# Patient Record
Sex: Male | Born: 1965 | Race: White | Hispanic: No | Marital: Married
Health system: Southern US, Community
[De-identification: ages and names within clinical notes are randomized; demographics above are authoritative.]

---

## 2020-10-30 ENCOUNTER — Other Ambulatory Visit: Payer: Self-pay

## 2020-10-30 ENCOUNTER — Emergency Department (HOSPITAL_COMMUNITY)
Admission: EM | Admit: 2020-10-30 | Discharge: 2020-10-30 | Disposition: A | Discharge status: Home / Self Care | Payer: Worker's Compensation | Attending: Emergency Medicine | Admitting: Emergency Medicine

## 2020-10-30 ENCOUNTER — Encounter (HOSPITAL_COMMUNITY): Payer: Self-pay | Admitting: Pharmacy Technician

## 2020-10-30 ENCOUNTER — Emergency Department (HOSPITAL_COMMUNITY): Payer: Worker's Compensation

## 2020-10-30 DIAGNOSIS — Z77028 Contact with and (suspected) exposure to other hazardous aromatic compounds: Secondary | ICD-10-CM | POA: Diagnosis not present

## 2020-10-30 DIAGNOSIS — Y990 Civilian activity done for income or pay: Secondary | ICD-10-CM | POA: Diagnosis not present

## 2020-10-30 DIAGNOSIS — R0602 Shortness of breath: Secondary | ICD-10-CM | POA: Insufficient documentation

## 2020-10-30 DIAGNOSIS — R519 Headache, unspecified: Secondary | ICD-10-CM | POA: Insufficient documentation

## 2020-10-30 DIAGNOSIS — Z7729 Contact with and (suspected ) exposure to other hazardous substances: Secondary | ICD-10-CM

## 2020-10-30 LAB — BASIC METABOLIC PANEL
Anion gap: 12 (ref 5–15)
BUN: 16 mg/dL (ref 6–20)
CO2: 21 mmol/L — ABNORMAL LOW (ref 22–32)
Calcium: 9.6 mg/dL (ref 8.9–10.3)
Chloride: 100 mmol/L (ref 98–111)
Creatinine, Ser: 0.98 mg/dL (ref 0.61–1.24)
GFR, Estimated: >60 mL/min (ref >60)
Glucose, Bld: 90 mg/dL (ref 70–99)
Potassium: 4.1 mmol/L (ref 3.5–5.1)
Sodium: 133 mmol/L — ABNORMAL LOW (ref 135–145)

## 2020-10-30 LAB — CBC WITH DIFFERENTIAL/PLATELET
Abs Immature Granulocytes: 0.1 10*3/uL — ABNORMAL HIGH (ref 0.00–0.07)
Basophils Absolute: 0.1 10*3/uL (ref 0.0–0.1)
Basophils Relative: 0 %
Eosinophils Absolute: 0.2 10*3/uL (ref 0.0–0.5)
Eosinophils Relative: 1 %
HCT: 43.3 % (ref 39.0–52.0)
Hemoglobin: 15.2 g/dL (ref 13.0–17.0)
Immature Granulocytes: 1 %
Lymphocytes Relative: 22 %
Lymphs Abs: 4.2 10*3/uL — ABNORMAL HIGH (ref 0.7–4.0)
MCH: 34 pg (ref 26.0–34.0)
MCHC: 35.1 g/dL (ref 30.0–36.0)
MCV: 96.9 fL (ref 80.0–100.0)
Monocytes Absolute: 1.2 10*3/uL — ABNORMAL HIGH (ref 0.1–1.0)
Monocytes Relative: 7 %
Neutro Abs: 13.3 10*3/uL — ABNORMAL HIGH (ref 1.7–7.7)
Neutrophils Relative %: 69 %
Platelets: 275 10*3/uL (ref 150–400)
RBC: 4.47 MIL/uL (ref 4.22–5.81)
RDW: 12.9 % (ref 11.5–15.5)
WBC: 19.1 10*3/uL — ABNORMAL HIGH (ref 4.0–10.5)
nRBC: 0 % (ref 0.0–0.2)

## 2020-10-30 LAB — COOXEMETRY PANEL
Carboxyhemoglobin: 8.6 % (ref 0.5–1.5)
Methemoglobin: 1.2 % (ref 0.0–1.5)
O2 Saturation: 87.2 %
Total hemoglobin: 15.4 g/dL (ref 12.0–16.0)

## 2020-10-30 NOTE — ED Provider Notes (Signed)
Emergency Medicine Provider Triage Evaluation Note  Jeremiah Klein , a 55 y.o. male  was evaluated in triage.  Pt complains of of carbon monoxide exposure at work.  Reports headache, shortness of breath and watery eyes.  Symptoms have improved since leaving work earlier today and "breathing fresh air."  Review of Systems  Positive: Shortness of breath, headache Negative: Chest pain  Physical Exam  BP 133/62 (BP Location: Right Arm)   Pulse 82   Temp 98.8 F (37.1 C) (Oral)   Resp 18   SpO2 99%  Gen:   Awake, no distress Resp:  Normal effort MSK:   Moves extremities without difficulty Other:  Speaking complete sentences without signs of respiratory distress  Medical Decision Making  Medically screening exam initiated at 2:54 PM.  Appropriate orders placed.  Jeremiah Klein was informed that the remainder of the evaluation will be completed by another provider, this initial triage assessment does not replace that evaluation, and the importance of remaining in the ED until their evaluation is complete.  Will order labs cxr coox   Jeremiah Pates, PA-C 10/30/20 1455    Jeremiah Grizzle, MD 11/02/20 [phone]

## 2020-10-30 NOTE — ED Triage Notes (Signed)
Pt here with carbon monoxide exposure from work. Pt endorses headache, eye irritation and difficulty breathing. 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

## 2020-10-30 NOTE — ED Provider Notes (Signed)
[facility] EMERGENCY DEPARTMENT Provider Note   CSN: [id number] Arrival date & time: 10/30/20  1320     History No chief complaint on file.   Jeremiah Klein is a 55 y.o. male.  HPI Patient presents with his car monoxide exposure.  Has had a headache and some mild shortness of breath.  Had multiple exposures at work.  Dull headache.  States he is feeling somewhat better than he had been prior.  No chest pain.  States it does feel little strange he takes a deep breath.  He is a smoker.    No past medical history on file.  There are no problems to display for this patient.   History reviewed. No pertinent surgical history.     No family history on file.     Home Medications Prior to Admission medications   Not on File    Allergies    Patient has no known allergies.  Review of Systems   Review of Systems  Constitutional: Negative for appetite change.  HENT: Negative for congestion.   Respiratory: Positive for shortness of breath.   Cardiovascular: Negative for chest pain.  Gastrointestinal: Negative for abdominal pain.  Genitourinary: Negative for flank pain.  Musculoskeletal: Negative for back pain.  Skin: Negative for rash.  Neurological: Positive for headaches.  Psychiatric/Behavioral: Negative for confusion.    Physical Exam Updated Vital Signs BP 112/62   Pulse 71   Temp 98.1 F (36.7 C) (Oral)   Resp 16   SpO2 100%   Physical Exam Vitals and nursing note reviewed.  HENT:     Head: Atraumatic.  Eyes:     Pupils: Pupils are equal, round, and reactive to light.  Cardiovascular:     Rate and Rhythm: Regular rhythm.  Pulmonary:     Effort: Pulmonary effort is normal.     Breath sounds: No wheezing, rhonchi or rales.  Abdominal:     Tenderness: There is no abdominal tenderness.  Musculoskeletal:        General: No tenderness.     Cervical back: Neck supple.  Skin:    General: Skin is warm.     Capillary Refill: Capillary  refill takes less than 2 seconds.  Neurological:     Mental Status: He is alert and oriented to person, place, and time.     ED Results / Procedures / Treatments   Labs (all labs ordered are listed, but only abnormal results are displayed) Labs Reviewed  BASIC METABOLIC PANEL - Abnormal; Notable for the following components:      Result Value   Sodium 133 (*)    CO2 21 (*)    All other components within normal limits  CBC WITH DIFFERENTIAL/PLATELET - Abnormal; Notable for the following components:   WBC 19.1 (*)    Neutro Abs 13.3 (*)    Lymphs Abs 4.2 (*)    Monocytes Absolute 1.2 (*)    Abs Immature Granulocytes 0.10 (*)    All other components within normal limits  COOXEMETRY PANEL - Abnormal; Notable for the following components:   Carboxyhemoglobin 8.6 (*)    All other components within normal limits    EKG None  Radiology DG Chest 2 View  Result Date: 10/30/2020 CLINICAL DATA:  Shortness of breath.  Carbon monoxide exposure EXAM: CHEST - 2 VIEW COMPARISON:  None. FINDINGS: Heart is normal size. No confluent opacities or effusions. No acute bony abnormality. IMPRESSION: No active cardiopulmonary disease. Electronically Signed   By: Caryn Bee  [place] M.D.   On: 10/30/2020 15:19    Procedures Procedures   Medications Ordered in ED Medications - No data to display  ED Course  I have reviewed the triage vital signs and the nursing notes.  Pertinent labs & imaging results that were available during my care of the patient were reviewed by me and considered in my medical decision making (see chart for details).    MDM Rules/Calculators/A&P                          Patient presents after likely car monoxide exposure.  Patient is also a smoker however.  Has a dull headache and mild shortness of breath.  Has been treated with over an hour of high flow oxygen.  Headache improved and feels much better.  I think this is likely not treatment at this time.  Does not appear to need  hyperbaric oxygen or other treatment.  Will discharge home. Final Clinical Impression(s) / ED Diagnoses Final diagnoses:  Carbon monoxide exposure    Rx / DC Orders ED Discharge Orders    None       Benjiman Core, MD 10/30/20 2043

## 2022-09-21 IMAGING — DX DG CHEST 2V
2 series · 2 of 2 positions shown · non-contrast
Comparison: None.

CLINICAL DATA: Shortness of breath.  Carbon monoxide exposure

EXAM:
CHEST - 2 VIEW

[chest pa]
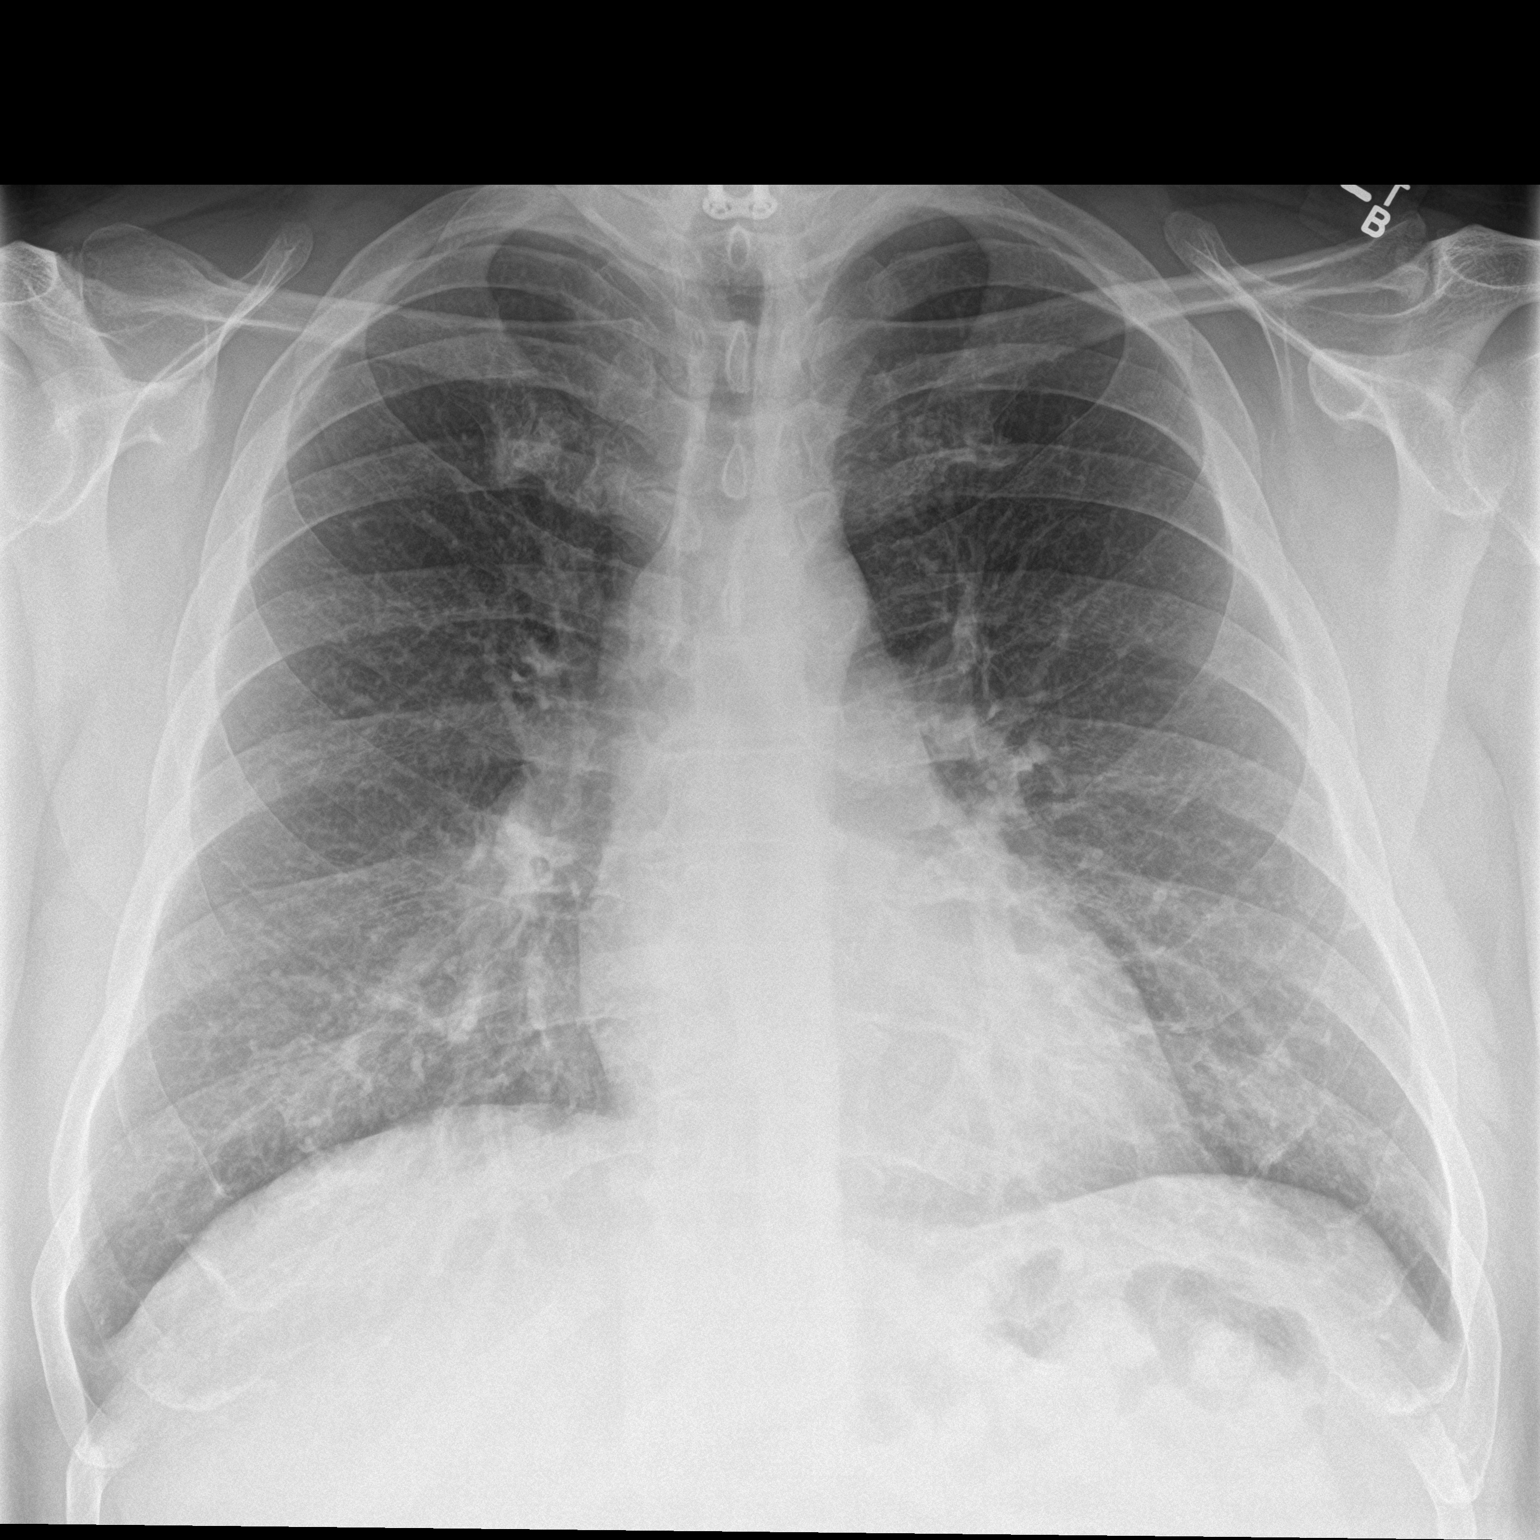

[chest lat]
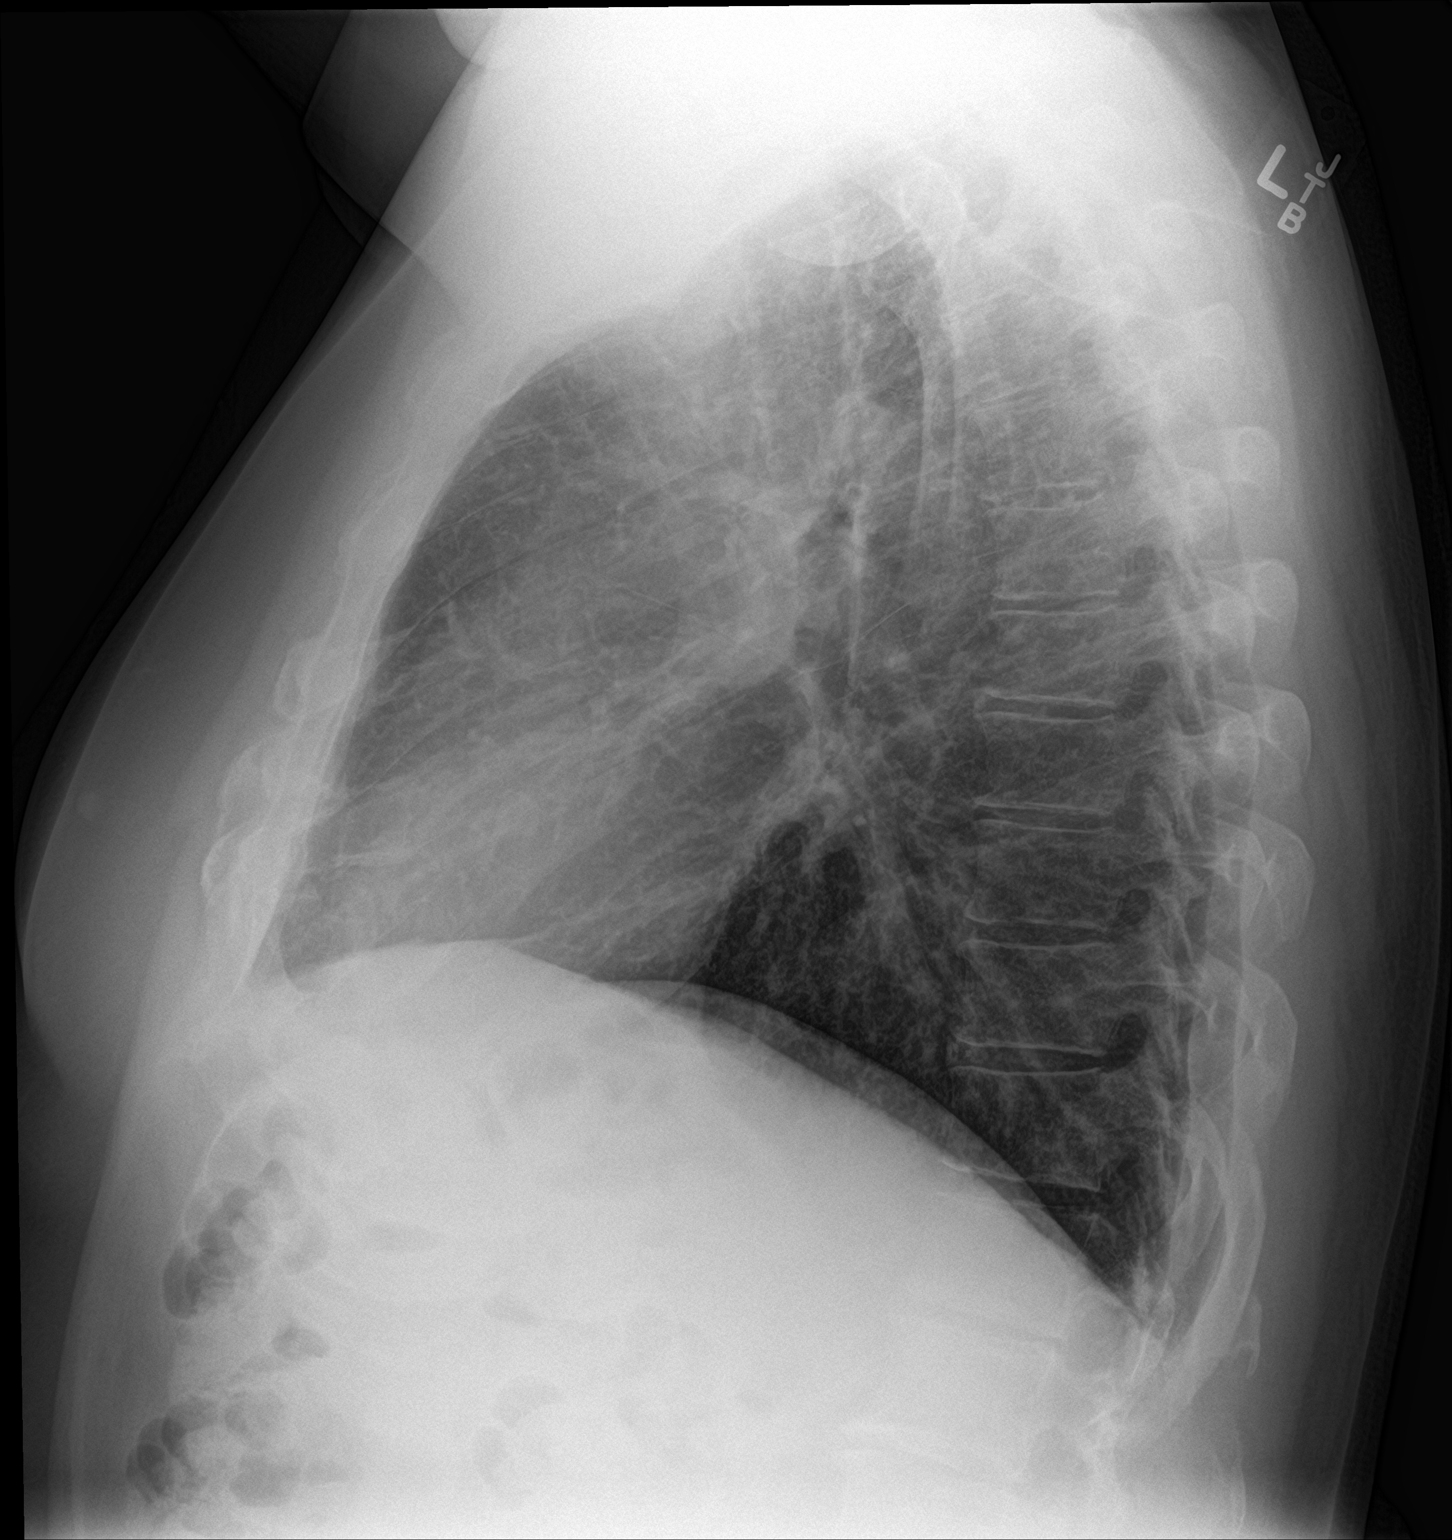

[2 of 2 positions shown; findings below may reference images not displayed]

FINDINGS: Heart is normal size. No confluent opacities or effusions. No acute
bony abnormality.
IMPRESSION: No active cardiopulmonary disease.
